# Patient Record
Sex: Male | Born: 1995 | Race: White | Hispanic: No | Marital: Single | Smoking: Never smoker
Health system: Southern US, Community
[De-identification: ages and names within clinical notes are randomized; demographics above are authoritative.]

## PROBLEM LIST (undated history)

## (undated) DIAGNOSIS — J020 Streptococcal pharyngitis: Secondary | ICD-10-CM

---
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
## 2018-03-13 ENCOUNTER — Emergency Department (HOSPITAL_COMMUNITY)
Admission: EM | Admit: 2018-03-13 | Discharge: 2018-03-13 | Disposition: A | Discharge status: Home / Self Care | Payer: BLUE CROSS/BLUE SHIELD | Attending: Emergency Medicine | Admitting: Emergency Medicine

## 2018-03-13 ENCOUNTER — Encounter (HOSPITAL_COMMUNITY): Payer: Self-pay | Admitting: Emergency Medicine

## 2018-03-13 ENCOUNTER — Other Ambulatory Visit: Payer: Self-pay | Admitting: Emergency Medicine

## 2018-03-13 ENCOUNTER — Other Ambulatory Visit: Payer: Self-pay

## 2018-03-13 DIAGNOSIS — R070 Pain in throat: Secondary | ICD-10-CM | POA: Diagnosis present

## 2018-03-13 DIAGNOSIS — J36 Peritonsillar abscess: Secondary | ICD-10-CM

## 2018-03-13 HISTORY — DX: Streptococcal pharyngitis: J02.0

## 2018-03-13 LAB — BASIC METABOLIC PANEL
ANION GAP: 12 (ref 5–15)
BUN: 10 mg/dL (ref 6–20)
CHLORIDE: 98 mmol/L (ref 98–111)
CO2: 23 mmol/L (ref 22–32)
Calcium: 9.7 mg/dL (ref 8.9–10.3)
Creatinine, Ser: 0.93 mg/dL (ref 0.61–1.24)
GFR calc Af Amer: >60 mL/min (ref >60)
GFR calc non Af Amer: >60 mL/min (ref >60)
GLUCOSE: 114 mg/dL — AB (ref 70–99)
POTASSIUM: 3.7 mmol/L (ref 3.5–5.1)
Sodium: 133 mmol/L — ABNORMAL LOW (ref 135–145)

## 2018-03-13 LAB — CBC WITH DIFFERENTIAL/PLATELET
Abs Immature Granulocytes: 0.1 10*3/uL (ref 0.0–0.1)
Basophils Absolute: 0 10*3/uL (ref 0.0–0.1)
Basophils Relative: 0 %
Eosinophils Absolute: 0.1 10*3/uL (ref 0.0–0.7)
Eosinophils Relative: 1 %
HEMATOCRIT: 45.7 % (ref 39.0–52.0)
Hemoglobin: 16.5 g/dL (ref 13.0–17.0)
IMMATURE GRANULOCYTES: 0 %
LYMPHS ABS: 0.8 10*3/uL (ref 0.7–4.0)
Lymphocytes Relative: 4 %
MCH: 31.2 pg (ref 26.0–34.0)
MCHC: 36.1 g/dL — ABNORMAL HIGH (ref 30.0–36.0)
MCV: 86.4 fL (ref 78.0–100.0)
MONOS PCT: 8 %
Monocytes Absolute: 1.5 10*3/uL — ABNORMAL HIGH (ref 0.1–1.0)
NEUTROS PCT: 87 %
Neutro Abs: 15.9 10*3/uL — ABNORMAL HIGH (ref 1.7–7.7)
Platelets: 180 10*3/uL (ref 150–400)
RBC: 5.29 MIL/uL (ref 4.22–5.81)
RDW: 11.8 % (ref 11.5–15.5)
WBC: 18.4 10*3/uL — ABNORMAL HIGH (ref 4.0–10.5)

## 2018-03-13 LAB — GROUP A STREP BY PCR: Group A Strep by PCR: DETECTED — AB

## 2018-03-13 MED ORDER — CLINDAMYCIN HCL 150 MG PO CAPS: 300.0000 mg | ORAL_CAPSULE | Freq: Three times a day (TID) | ORAL | 0 refills | Status: AC

## 2018-03-13 MED ORDER — IBUPROFEN 100 MG/5ML PO SUSP
400.0000 mg | Freq: Once | ORAL | Status: AC
  Administered 2018-03-13: 400 mg via ORAL

## 2018-03-13 MED ORDER — ACETAMINOPHEN 325 MG PO TABS: 650.0000 mg | ORAL_TABLET | Freq: Once | ORAL | Status: DC | PRN

## 2018-03-13 MED ORDER — SODIUM CHLORIDE 0.9 % IV BOLUS
1000.0000 mL | Freq: Once | INTRAVENOUS | Status: AC
  Administered 2018-03-13: 1000 mL via INTRAVENOUS

## 2018-03-13 MED ORDER — DEXAMETHASONE 10 MG/ML FOR PEDIATRIC ORAL USE
10.0000 mg | Freq: Once | INTRAMUSCULAR | Status: DC
  Filled 2018-03-13 (×2): qty 1

## 2018-03-13 MED ORDER — TRAMADOL HCL 50 MG PO TABS: 50.0000 mg | ORAL_TABLET | Freq: Two times a day (BID) | ORAL | 0 refills | Status: AC | PRN

## 2018-03-13 MED ORDER — IBUPROFEN 800 MG PO TABS: 800.0000 mg | ORAL_TABLET | Freq: Three times a day (TID) | ORAL | 0 refills | Status: AC

## 2018-03-13 MED ORDER — DEXAMETHASONE SODIUM PHOSPHATE 10 MG/ML IJ SOLN
10.0000 mg | Freq: Once | INTRAMUSCULAR | Status: AC
  Administered 2018-03-13: 10 mg via INTRAVENOUS
  Filled 2018-03-13: qty 1

## 2018-03-13 MED ORDER — CLINDAMYCIN PHOSPHATE 600 MG/50ML IV SOLN
600.0000 mg | Freq: Once | INTRAVENOUS | Status: AC
Start: 2018-03-13 — End: 2018-03-13
  Administered 2018-03-13: 600 mg via INTRAVENOUS
  Filled 2018-03-13: qty 50

## 2018-03-13 NOTE — ED Provider Notes (Signed)
Patient placed in Quick Look pathway, seen and evaluated   Chief Complaint: sore throat  HPI:   Eden LatheWyatt E Sheriff is a 22 y.o. male who presents to the ED with a sore throat and fever that started 2 days ago and has gotten worse. Patient reports severe pain with swallowing.   ROS: General: fever   ENT: sore throat  Neck: gland swelling  Physical Exam:  BP 134/85   Pulse (!) 108   Temp (!) 102.1 F (38.9 C) (Oral)   Resp 18   SpO2 98%    Gen: No distress, febrile  Neuro: Awake and Alert  Skin: Warm and dry  ENT: throat with erythema, tonsils swollen  Neck: anterior cervical nodes enlarged and tender  Heart: tachycardic        Initiation of care has begun. The patient has been counseled on the process, plan, and necessity for staying for the completion/evaluation, and the remainder of the medical screening examination    Janne Napoleoneese, Hope M, NP 03/13/18 1532    Gerhard MunchLockwood, Robert, MD 03/14/18 [phone]

## 2018-03-13 NOTE — Discharge Instructions (Signed)
Take antibiotics as precribed. Take the entire course, even if your symptoms improve.  Take ibuprofen 3 times a day with meals.  Do not take other anti-inflammatories at the same time open (Advil, Motrin, naproxen, Aleve). You may supplement with Tylenol if you need further pain control. Use tramadol as needed for severe pain.  Follow up with the ENT doctor in 2 weeks for further evaluation.  Return to the ER if you develop difficulty breathing, inability to swallow, or any new, worsening, or concerning symptoms.

## 2018-03-13 NOTE — Consult Note (Signed)
Reason for Consult: Peritonsillar abscess Referring Physician: ER  Glenn Smith is an 22 y.o. male.  HPI: 22 year old male with two day history of left-sided throat pain and swelling.  He previously required incision and drainage of a peri-tonsillar abscess (he can't remember which side) one year ago and this episode feels similar.  He has pain with swallowing but has been able to drink.  He cannot open his mouth fully and has had some fever.  Past Medical History:  Diagnosis Date  . Strep throat     History reviewed. No pertinent surgical history.  No family history on file.  Social History:  reports that he has never smoked. He has never used smokeless tobacco. He reports that he does not drink alcohol or use drugs.  Allergies: No Known Allergies  Medications: I have reviewed the patient's current medications.  Results for orders placed or performed during the hospital encounter of 03/13/18 (from the past 48 hour(s))  CBC with Differential     Status: Abnormal   Collection Time: 03/13/18  4:33 PM  Result Value Ref Range   WBC 18.4 (H) 4.0 - 10.5 K/uL   RBC 5.29 4.22 - 5.81 MIL/uL   Hemoglobin 16.5 13.0 - 17.0 g/dL   HCT 45.7 39.0 - 52.0 %   MCV 86.4 78.0 - 100.0 fL   MCH 31.2 26.0 - 34.0 pg   MCHC 36.1 (H) 30.0 - 36.0 g/dL   RDW 11.8 11.5 - 15.5 %   Platelets 180 150 - 400 K/uL   Neutrophils Relative % 87 %   Neutro Abs 15.9 (H) 1.7 - 7.7 K/uL   Lymphocytes Relative 4 %   Lymphs Abs 0.8 0.7 - 4.0 K/uL   Monocytes Relative 8 %   Monocytes Absolute 1.5 (H) 0.1 - 1.0 K/uL   Eosinophils Relative 1 %   Eosinophils Absolute 0.1 0.0 - 0.7 K/uL   Basophils Relative 0 %   Basophils Absolute 0.0 0.0 - 0.1 K/uL   Immature Granulocytes 0 %   Abs Immature Granulocytes 0.1 0.0 - 0.1 K/uL    Comment: Performed at [facility], [address] [place], [place] [id number]  Basic metabolic panel     Status: Abnormal   Collection Time: 03/13/18  4:33 PM  Result Value Ref Range    Sodium 133 (L) 135 - 145 mmol/L   Potassium 3.7 3.5 - 5.1 mmol/L   Chloride 98 98 - 111 mmol/L    Comment: Please note change in reference range.   CO2 23 22 - 32 mmol/L   Glucose, Bld 114 (H) 70 - 99 mg/dL    Comment: Please note change in reference range.   BUN 10 6 - 20 mg/dL    Comment: Please note change in reference range.   Creatinine, Ser 0.93 0.61 - 1.24 mg/dL   Calcium 9.7 8.9 - 10.3 mg/dL   GFR calc non Af Amer >60 >60 mL/min   GFR calc Af Amer >60 >60 mL/min    Comment: (NOTE) The eGFR has been calculated using the CKD EPI equation. This calculation has not been validated in all clinical situations. eGFR's persistently <60 mL/min signify possible Chronic Kidney Disease.    Anion gap 12 5 - 15    Comment: Performed at [facility] [address], [place], [place] [address]    No results found.  Review of Systems  Constitutional: Positive for fever.  HENT: Positive for sore throat.   All  other systems reviewed and are negative.  Blood pressure 110/70, pulse 100, temperature (!) 102.1 F (38.9 C), temperature source Oral, resp. rate 18, SpO2 96 %. Physical Exam  Constitutional: He is oriented to person, place, and time. He appears well-developed and well-nourished.  HENT:  Head: Normocephalic and atraumatic.  Right Ear: External ear normal.  Left Ear: External ear normal.  Nose: Nose normal.  Mild trismus.  Hot potato voice.  Left peritonsillar fullness and tonsillar exudate.  Eyes: Pupils are equal, round, and reactive to light. Conjunctivae and EOM are normal.  Neck: Normal range of motion. Neck supple.  Left upper neck tender.  Cardiovascular: Normal rate.  Respiratory: Effort normal.  Musculoskeletal: Normal range of motion.  Neurological: He is alert and oriented to person, place, and time. No cranial nerve deficit.  Skin: Skin is warm and dry.  Psychiatric: He has a normal mood and affect. His behavior is normal. Judgment and thought content  normal.    Assessment/Plan: Left peritonsillar abscess  The abscess was drained at the bedside.  See procedure note.  He will be prescribed clindamycin for 10 days and was encouraged to stay well-hydrated.  He will follow-up with me in two weeks.  Aylanie Cubillos 03/13/2018, 5:35 PM

## 2018-03-13 NOTE — Procedures (Signed)
Preop diagnosis: Left peritonsillar abscess Postop diagnosis: same Procedure: Incision and drainage of left peritonsillar abscess Surgeon: Jenne PaneBates Anesth: Topical with cetacaine and local with 1% lidocaine with 1:100,000 epinephrine Compl: None Findings: Purulent fluid from left peritonsillar space. Description:  After discussing risks, benefits, and alternatives, the patient was placed in a seated position and left oropharynx was sprayed twice with cetacaine.  The left peritonsillar incision site was then injected with local anesthetic.  An angled incision was made superior to the left tonsil with a 15 blade and was then bluntly dissected with a tonsil clamp until pus drained.  The wound was explored a second time and more drainage resulted.  He was then returned to nursing care in stable condition.

## 2018-03-13 NOTE — ED Triage Notes (Signed)
Pt c/o sore throat and pain with swallowing x 2 days.

## 2018-03-13 NOTE — ED Provider Notes (Signed)
[facility]   CSN: [id number] Arrival date & time: 03/13/18  1508     History   Chief Complaint Chief Complaint  Patient presents with  . Sore Throat    HPI Glenn Smith is a 22 y.o. male.  22 year old male presents with complaint of sore throat, similar to previous peri tonsillar abscess.  Patient states that in November he was seen to aspirate the ER for a peritonsillar abscess that was drained by ENT, advised on his tonsils out at that time however he is unable to take 2 weeks off from work to have this done.  Patient states sore throat more so on the left side, pain with swallowing and talking.  Difficulty opening his mouth, fevers.  No known sick contacts, no other complaints or concerns.     Past Medical History:  Diagnosis Date  . Strep throat     There are no active problems to display for this patient.   History reviewed. No pertinent surgical history.      Home Medications    Prior to Admission medications   Not on File    Family History No family history on file.  Social History Social History   Tobacco Use  . Smoking status: Never Smoker  . Smokeless tobacco: Never Used  Substance Use Topics  . Alcohol use: Never    Frequency: Never  . Drug use: Never     Allergies   Patient has no known allergies.   Review of Systems Review of Systems  Constitutional: Positive for fever.  HENT: Positive for sore throat, trouble swallowing and voice change. Negative for congestion, sinus pressure and sinus pain.   Respiratory: Negative for cough and shortness of breath.   Gastrointestinal: Negative for nausea and vomiting.  Skin: Negative for rash and wound.  Allergic/Immunologic: Negative for immunocompromised state.  Neurological: Negative for weakness and headaches.  Hematological: Positive for adenopathy. Does not bruise/bleed easily.  Psychiatric/Behavioral: Negative for confusion.  All  other systems reviewed and are negative.    Physical Exam Updated Vital Signs BP 110/70   Pulse 100   Temp (!) 102.1 F (38.9 C) (Oral)   Resp 18   SpO2 96%   Physical Exam  Constitutional: He is oriented to person, place, and time. He appears well-developed and well-nourished.  HENT:  Head: Normocephalic and atraumatic.  Right Ear: Tympanic membrane and ear canal normal.  Left Ear: Tympanic membrane and ear canal normal.  Mouth/Throat: Mucous membranes are normal. There is trismus in the jaw. Oropharyngeal exudate and tonsillar abscesses present. Tonsils are 2+ on the right. Tonsils are 3+ on the left. Tonsillar exudate.  Mild trismus,tonsils enlarged, left greater than right, light injury noted to left tonsil.  Soft tissue swelling extending into the soft palate on the left  Cardiovascular: Tachycardia present.  Pulmonary/Chest: Effort normal and breath sounds normal.  Lymphadenopathy:    He has cervical adenopathy.  Neurological: He is alert and oriented to person, place, and time.  Skin: Skin is warm and dry. No rash noted.  Psychiatric: He has a normal mood and affect. His behavior is normal.  Nursing note and vitals reviewed.    ED Treatments / Results  Labs (all labs ordered are listed, but only abnormal results are displayed) Labs Reviewed  CBC WITH DIFFERENTIAL/PLATELET - Abnormal; Notable for the following components:      Result Value   WBC 18.4 (*)    MCHC 36.1 (*)  Neutro Abs 15.9 (*)    Monocytes Absolute 1.5 (*)    All other components within normal limits  BASIC METABOLIC PANEL    EKG None  Radiology No results found.  Procedures Procedures (including critical care time)  Medications Ordered in ED Medications  acetaminophen (TYLENOL) tablet 650 mg (has no administration in time range)  sodium chloride 0.9 % bolus 1,000 mL (1,000 mLs Intravenous New Bag/Given 03/13/18 1636)  clindamycin (CLEOCIN) IVPB 600 mg (600 mg Intravenous New Bag/Given  03/13/18 1646)  ibuprofen (ADVIL,MOTRIN) 100 MG/5ML suspension 400 mg (400 mg Oral Given 03/13/18 1602)  dexamethasone (DECADRON) injection 10 mg (10 mg Intravenous Given 03/13/18 1629)     Initial Impression / Assessment and Plan / ED Course  I have reviewed the triage vital signs and the nursing notes.  Pertinent labs & imaging results that were available during my care of the patient were reviewed by me and considered in my medical decision making (see chart for details).  Clinical Course as of Mar 13 1706  Thu Mar 13, 2018  1924 22 year old male complains of sore throat for the past few days today, pain worse on the left side and states that similar to when he had a peritonsillar abscess in November that was drained at [facility].  On exam patient appears to have a left peritonsillar abscess.  Case discussed with Dr. Ranae Palms, ER attending who agrees with plan to consult ENT.  Case discussed with Dr. Jenne Pane, ENT on-call who will come and see the patient.   [LM]  1707 Care signed out to [place], [place] pending ENT evaluation.    [LM]    Clinical Course User Index [LM] Jeannie Fend, PA-C     Final Clinical Impressions(s) / ED Diagnoses   Final diagnoses:  Peritonsillar abscess    ED Discharge Orders    None       Alden Hipp 03/13/18 1707    Loren Racer, MD 03/13/18 2133

## 2018-03-13 NOTE — ED Provider Notes (Signed)
Pt signed out to me by Augustine RadarL Murphy, PA-C. Please see previous notes for further history.   In brief, pt with PTA. Has had abx and decadron. Dr. Jenne PaneBates from ENT to see. Dispo pending ENT.   Dr. Jenne PaneBates evaluated the pt, recommends pt be discharged on clinda tid x10 days, 2 wk f/u. PMP checked, pt with limited controlled substance rx's in the past 2 years. Will give short supply tramadol as needed. At this time, pt appears safe for d/c. return precautions given. Pt states he understands and agrees to plan.    Alveria ApleyCaccavale, Tobechukwu Emmick, PA-C 03/13/18 1747    Arby BarrettePfeiffer, Marcy, MD 03/29/18 [phone]

## 2020-05-18 ENCOUNTER — Ambulatory Visit: Admit: 2020-05-18 | Discharge status: Absent without leave | Payer: BC Managed Care – PPO

## 2020-05-18 ENCOUNTER — Ambulatory Visit: Payer: Self-pay

## 2020-08-15 ENCOUNTER — Ambulatory Visit
Admission: EM | Admit: 2020-08-15 | Discharge: 2020-08-15 | Disposition: A | Discharge status: Home / Self Care | Payer: BC Managed Care – PPO
